# Patient Record
Sex: Female | Born: 1980 | Race: White | Hispanic: No | Marital: Married | Smoking: Never smoker
Health system: Southern US, Community
[De-identification: ages and names within clinical notes are randomized; demographics above are authoritative.]

---
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
## 2020-11-22 ENCOUNTER — Emergency Department (HOSPITAL_BASED_OUTPATIENT_CLINIC_OR_DEPARTMENT_OTHER)
Admission: EM | Admit: 2020-11-22 | Discharge: 2020-11-22 | Disposition: A | Discharge status: Home / Self Care | Payer: BC Managed Care – PPO | Attending: Emergency Medicine | Admitting: Emergency Medicine

## 2020-11-22 ENCOUNTER — Emergency Department (HOSPITAL_BASED_OUTPATIENT_CLINIC_OR_DEPARTMENT_OTHER): Payer: BC Managed Care – PPO

## 2020-11-22 ENCOUNTER — Other Ambulatory Visit: Payer: Self-pay

## 2020-11-22 ENCOUNTER — Encounter (HOSPITAL_BASED_OUTPATIENT_CLINIC_OR_DEPARTMENT_OTHER): Payer: Self-pay | Admitting: *Deleted

## 2020-11-22 DIAGNOSIS — R079 Chest pain, unspecified: Secondary | ICD-10-CM | POA: Insufficient documentation

## 2020-11-22 DIAGNOSIS — R197 Diarrhea, unspecified: Secondary | ICD-10-CM | POA: Diagnosis not present

## 2020-11-22 DIAGNOSIS — R109 Unspecified abdominal pain: Secondary | ICD-10-CM | POA: Diagnosis present

## 2020-11-22 DIAGNOSIS — K8050 Calculus of bile duct without cholangitis or cholecystitis without obstruction: Secondary | ICD-10-CM

## 2020-11-22 LAB — HEPATIC FUNCTION PANEL
ALT: 21 U/L (ref 0–44)
AST: 20 U/L (ref 15–41)
Albumin: 4.2 g/dL (ref 3.5–5.0)
Alkaline Phosphatase: 56 U/L (ref 38–126)
Bilirubin, Direct: 0.1 mg/dL (ref 0.0–0.2)
Indirect Bilirubin: 0.4 mg/dL (ref 0.3–0.9)
Total Bilirubin: 0.5 mg/dL (ref 0.3–1.2)
Total Protein: 7.4 g/dL (ref 6.5–8.1)

## 2020-11-22 LAB — CBC
HCT: 43.3 % (ref 36.0–46.0)
Hemoglobin: 14.6 g/dL (ref 12.0–15.0)
MCH: 29.9 pg (ref 26.0–34.0)
MCHC: 33.7 g/dL (ref 30.0–36.0)
MCV: 88.5 fL (ref 80.0–100.0)
Platelets: 269 10*3/uL (ref 150–400)
RBC: 4.89 MIL/uL (ref 3.87–5.11)
RDW: 12.4 % (ref 11.5–15.5)
WBC: 9.5 10*3/uL (ref 4.0–10.5)
nRBC: 0 % (ref 0.0–0.2)

## 2020-11-22 LAB — BASIC METABOLIC PANEL
Anion gap: 10 (ref 5–15)
BUN: 15 mg/dL (ref 6–20)
CO2: 24 mmol/L (ref 22–32)
Calcium: 9 mg/dL (ref 8.9–10.3)
Chloride: 105 mmol/L (ref 98–111)
Creatinine, Ser: 0.7 mg/dL (ref 0.44–1.00)
GFR, Estimated: >60 mL/min (ref >60)
Glucose, Bld: 95 mg/dL (ref 70–99)
Potassium: 3.7 mmol/L (ref 3.5–5.1)
Sodium: 139 mmol/L (ref 135–145)

## 2020-11-22 LAB — PREGNANCY, URINE: Preg Test, Ur: NEGATIVE

## 2020-11-22 LAB — LIPASE, BLOOD: Lipase: 28 U/L (ref 11–51)

## 2020-11-22 LAB — TROPONIN I (HIGH SENSITIVITY): Troponin I (High Sensitivity): <2 ng/L (ref <18)

## 2020-11-22 NOTE — Discharge Instructions (Addendum)
Follow up with General Surgery as soon as possible. Return to Emergency Room for fevers or increased pain.

## 2020-11-22 NOTE — ED Triage Notes (Signed)
She woke at 2am with "reflux" and vomiting. States she had the same thing happen 2 weeks ago. EKG done at triage.

## 2020-11-22 NOTE — ED Notes (Signed)
ED Provider at bedside. 

## 2020-11-22 NOTE — ED Notes (Signed)
Pt discharged to home. Discharge instructions have been discussed with patient and/or family members. Pt verbally acknowledges understanding d/c instructions, and endorses comprehension to checkout at registration before leaving.  °

## 2020-11-22 NOTE — ED Provider Notes (Signed)
[facility] EMERGENCY DEPARTMENT Provider Note   CSN: [id number] Arrival date & time: 11/22/20  1424     History Chief Complaint  Patient presents with  . Abdominal Pain  . Emesis    Jacqueline Evans is a 40 y.o. female.  HPI Patient presents with reflux nausea and vomiting and a slight amount of diarrhea.  Began around 2 this morning when she woke up with it.  States she had a similar episode 2 weeks ago that resolved after a day.  Has been doing well in between.  States she is feeling somewhat better in terms of her nausea.  Had vomiting was nonbilious none bloody.  Then nausea is now improved somewhat.  No fevers.  No cough.  Denies possibly of pregnancy.  She is unvaccinated for Covid.  Only abdominal surgery is C-section.  No fevers.  No chills.  No coughing.    History reviewed. No pertinent past medical history.  There are no problems to display for this patient.   Past Surgical History:  Procedure Laterality Date  . CESAREAN SECTION       OB History   No obstetric history on file.     No family history on file.  Social History   Tobacco Use  . Smoking status: Never Smoker  . Smokeless tobacco: Never Used  Substance Use Topics  . Alcohol use: Yes  . Drug use: Never    Home Medications Prior to Admission medications   Not on File    Allergies    Penicillins  Review of Systems   Review of Systems  Constitutional: Negative for appetite change and fever.  HENT: Negative for congestion.   Respiratory: Negative for shortness of breath.   Cardiovascular: Positive for chest pain.  Gastrointestinal: Positive for abdominal pain, nausea and vomiting.  Genitourinary: Negative for dysuria.  Musculoskeletal: Negative for back pain.  Skin: Negative for rash.  Neurological: Negative for weakness.  Psychiatric/Behavioral: Negative for confusion.    Physical Exam Updated Vital Signs BP (!) 112/52   Pulse 61   Temp 98.2 F (36.8 C) (Oral)    Resp (!) 22   Ht 5\' 2"  (1.575 m)   Wt 108.7 kg   LMP 11/01/2020   SpO2 97%   BMI 43.84 kg/m   Physical Exam Vitals and nursing note reviewed.  Constitutional:      Appearance: She is well-developed. She is obese.  HENT:     Head: Normocephalic.  Eyes:     General: No scleral icterus. Cardiovascular:     Rate and Rhythm: Normal rate and regular rhythm.  Pulmonary:     Effort: Pulmonary effort is normal.     Breath sounds: Normal breath sounds.  Abdominal:     Hernia: No hernia is present.     Comments: Epigastric to right upper quadrant tenderness without rebound or guarding.  Skin:    General: Skin is warm.     Capillary Refill: Capillary refill takes less than 2 seconds.  Neurological:     Mental Status: She is alert and oriented to person, place, and time.  Psychiatric:        Behavior: Behavior normal.     ED Results / Procedures / Treatments   Labs (all labs ordered are listed, but only abnormal results are displayed) Labs Reviewed  BASIC METABOLIC PANEL  CBC  PREGNANCY, URINE  HEPATIC FUNCTION PANEL  LIPASE, BLOOD  TROPONIN I (HIGH SENSITIVITY)    EKG EKG Interpretation  Date/Time:  Thursday  November 22 2020 14:34:19 EST Ventricular Rate:  75 PR Interval:  152 QRS Duration: 76 QT Interval:  372 QTC Calculation: 415 R Axis:   67 Text Interpretation: Normal sinus rhythm Abnormal ECG No old tracing to compare Confirmed by Benjiman Core [phone]) on 11/22/2020 3:12:31 PM   Radiology DG Chest 2 View  Result Date: 11/22/2020 CLINICAL DATA:  Heartburn and vomiting. EXAM: CHEST - 2 VIEW COMPARISON:  None. FINDINGS: The heart size and mediastinal contours are within normal limits. Both lungs are clear. The visualized skeletal structures are unremarkable. IMPRESSION: No active cardiopulmonary disease. Electronically Signed   By: Aram Candela M.D.   On: 11/22/2020 15:08   US Abdomen Limited  Result Date: 11/22/2020 CLINICAL DATA:  Right upper quadrant pain  Nausea Vomiting EXAM: ULTRASOUND ABDOMEN LIMITED RIGHT UPPER QUADRANT COMPARISON:  None. FINDINGS: Gallbladder: Gallbladder filled with sludge. Gallbladder wall is thickened. Findings suspicious for acute cholecystitis. Common bile duct: Diameter: 4 mm Liver: No focal lesion identified. Within normal limits in parenchymal echogenicity. Portal vein is patent on color Doppler imaging with normal direction of blood flow towards the liver. Other: None. IMPRESSION: Sludge filled gallbladder with thickening of gallbladder wall suspicious for acute cholecystitis. Electronically Signed   By: Acquanetta Belling M.D.   On: 11/22/2020 16:24    Procedures Procedures   Medications Ordered in ED Medications - No data to display  ED Course  I have reviewed the triage vital signs and the nursing notes.  Pertinent labs & imaging results that were available during my care of the patient were reviewed by me and considered in my medical decision making (see chart for details).    MDM Rules/Calculators/A&P                          Patient with nausea vomiting some abdominal pain.  Some chest pain.  Lab work is reassuring with no abnormalities.  However ultrasound done due to right upper quadrant tenderness to evaluate for biliary colic/cholecystitis.  Did show some potential wall thickening and sludge.  Patient's exam has improved still has some tenderness but states she is feeling better.  Tolerated orals throughout stay with water.  Discussed with Dr. Sheliah Hatch from general surgery.  Will expedite follow-up as an outpatient.  Likely biliary colic but may be tending towards cholecystitis but will not start antibiotics at this time with normal white count and improving symptoms.  Will return to ER sooner if symptoms return or worsen.  Will return for fevers.  Does not appear to have bowel obstruction, gastroenteritis, or appendicitis. Final Clinical Impression(s) / ED Diagnoses Final diagnoses:  Biliary colic    Rx /  DC Orders ED Discharge Orders    None       Benjiman Core, MD 11/22/20 1927

## 2020-11-22 NOTE — ED Notes (Signed)
Pt in gown, placed on 5 lead, comfortable at this time. IV placed in left [facility]

## 2022-06-25 IMAGING — US US ABDOMEN LIMITED RUQ/ASCITES
1 series · 14 of 25 positions shown · non-contrast
Comparison: None.

CLINICAL DATA: Right upper quadrant pain

Nausea
Vomiting
EXAM:
ULTRASOUND ABDOMEN LIMITED RIGHT UPPER QUADRANT

[Series 1: us abdomen limited ruq/ascites · 14 of 49 slices shown]
[im 1/49]
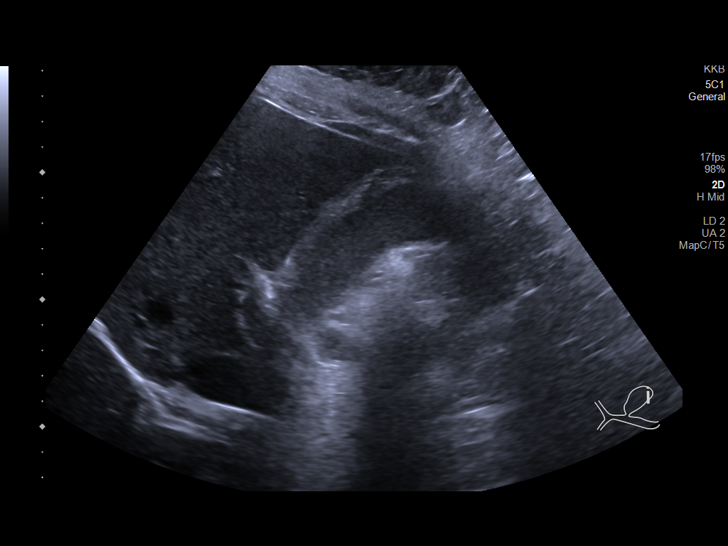
[im 5/49]
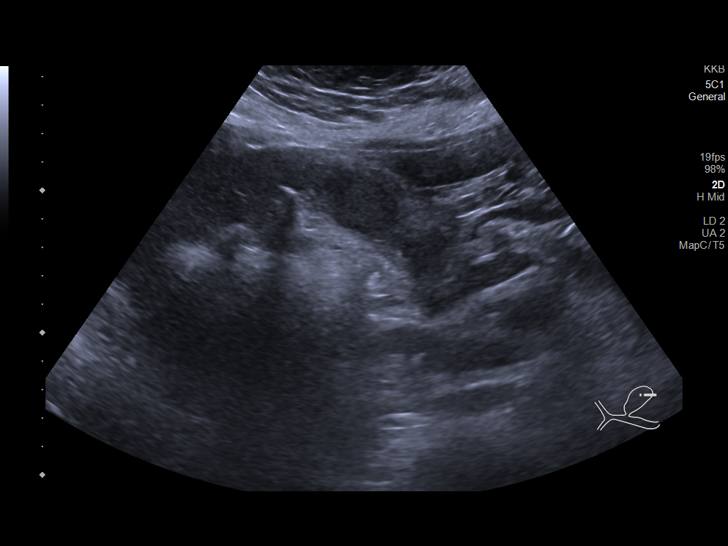
[im 9/49]
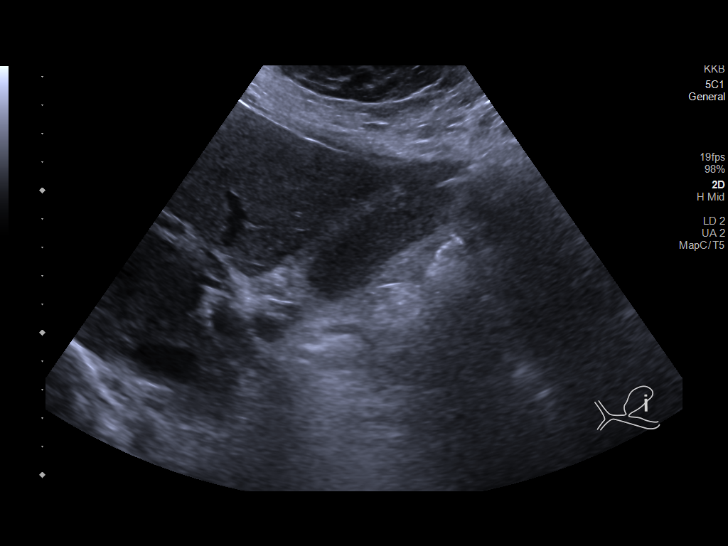
[im 13/49]
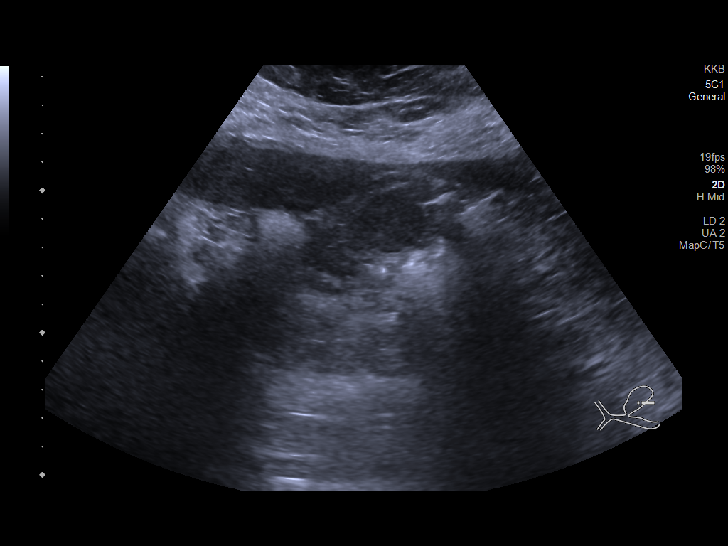
[im 17/49]
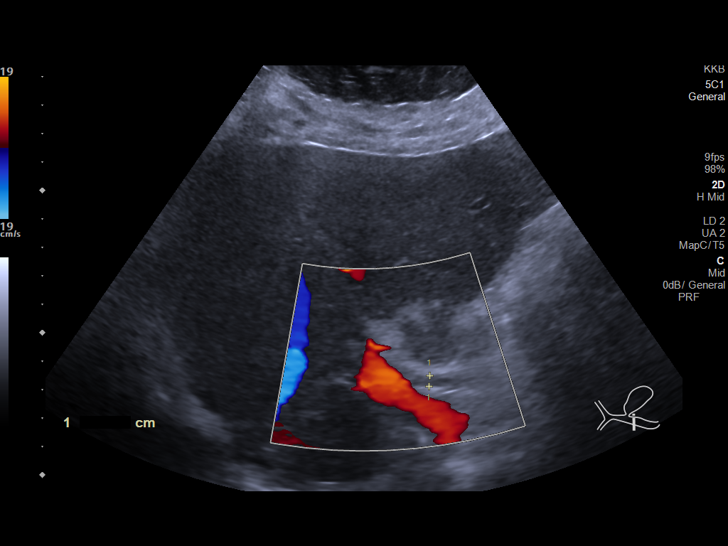
[im 19/49]
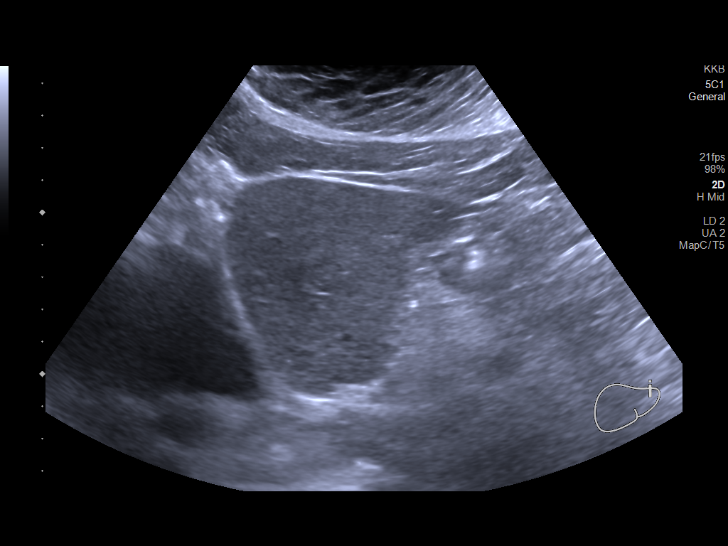
[im 23/49]
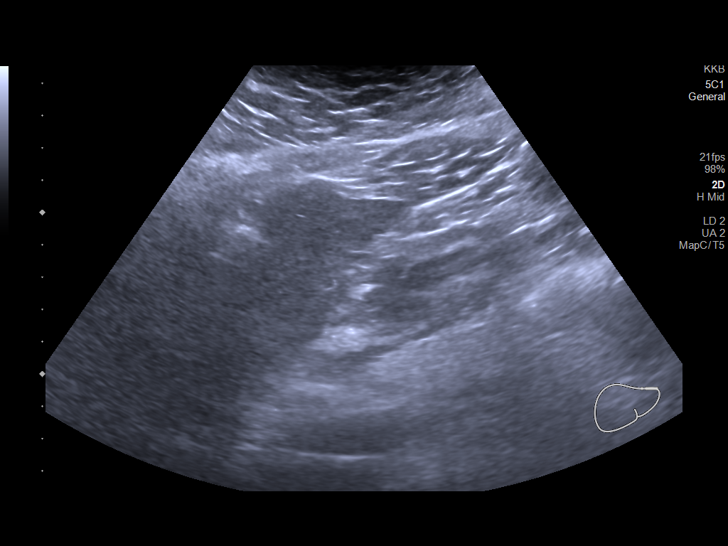
[im 27/49]
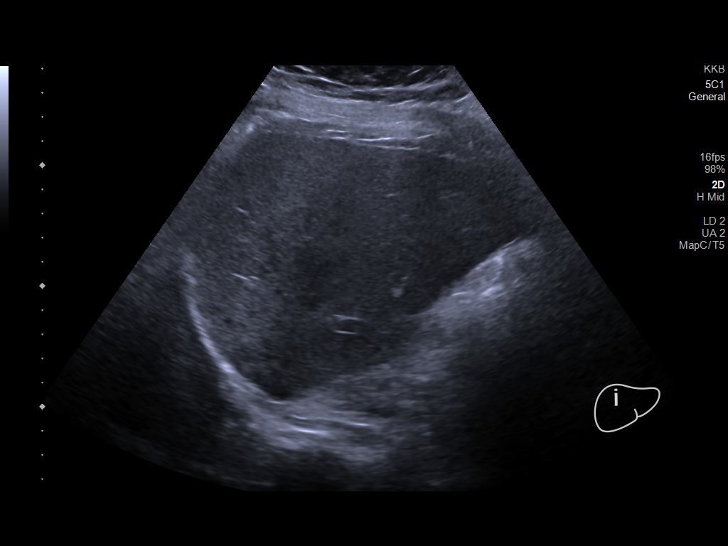
[im 31/49]
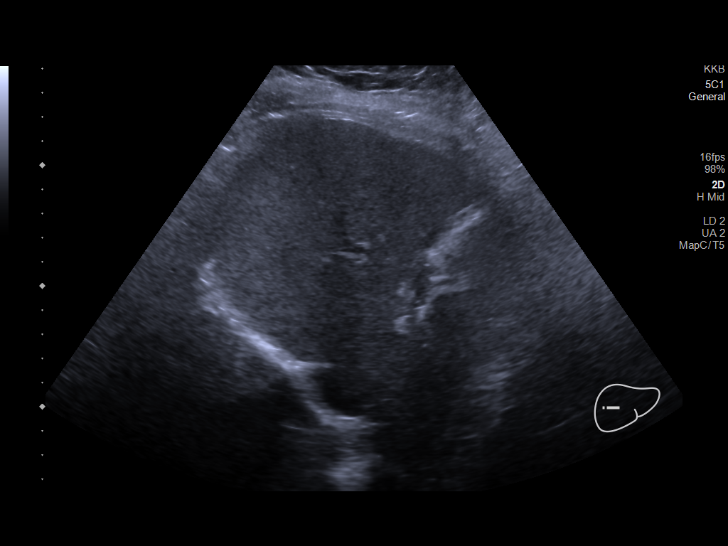
[im 33/49]
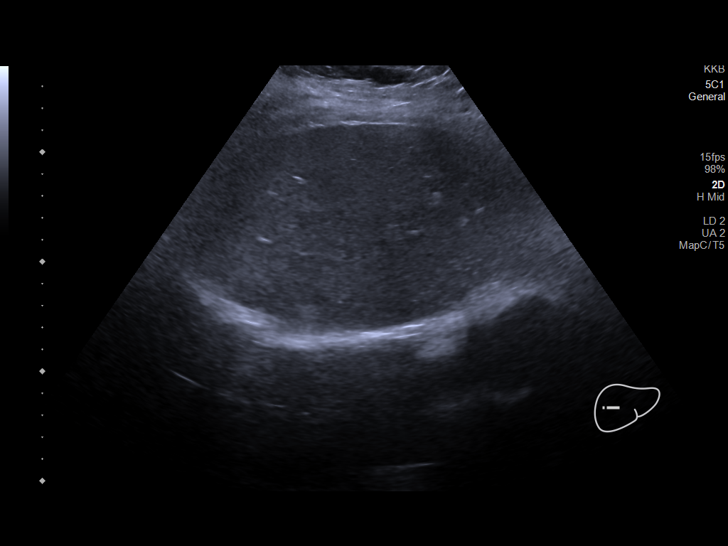
[im 37/49]
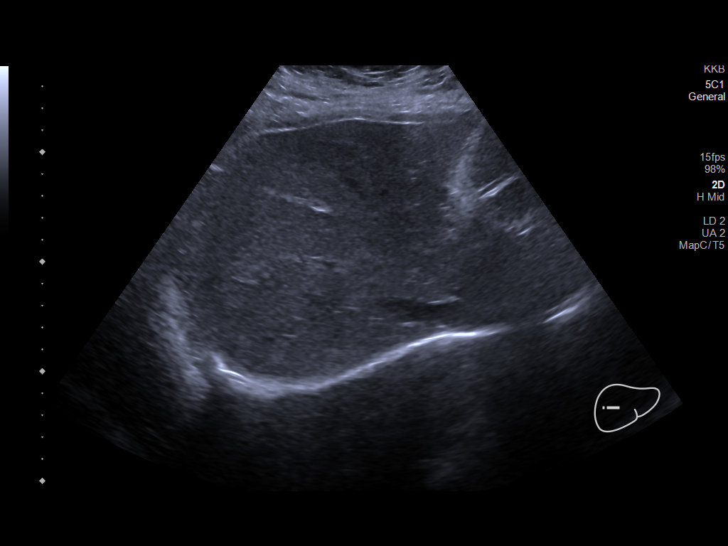
[im 41/49]
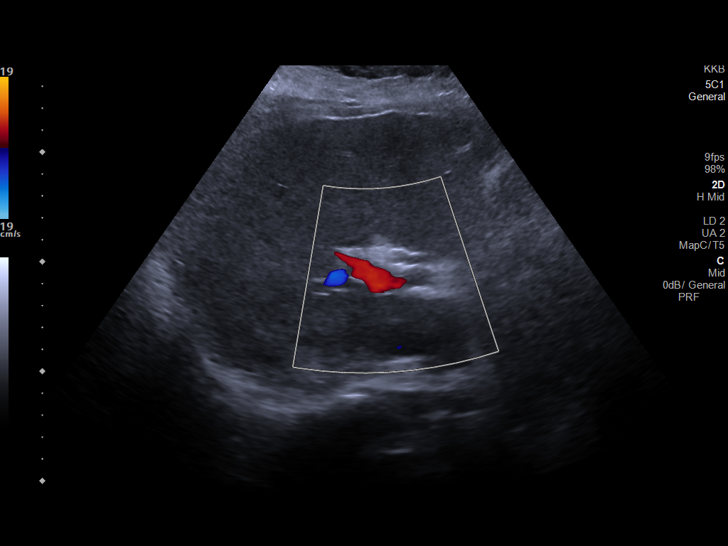
[im 45/49]
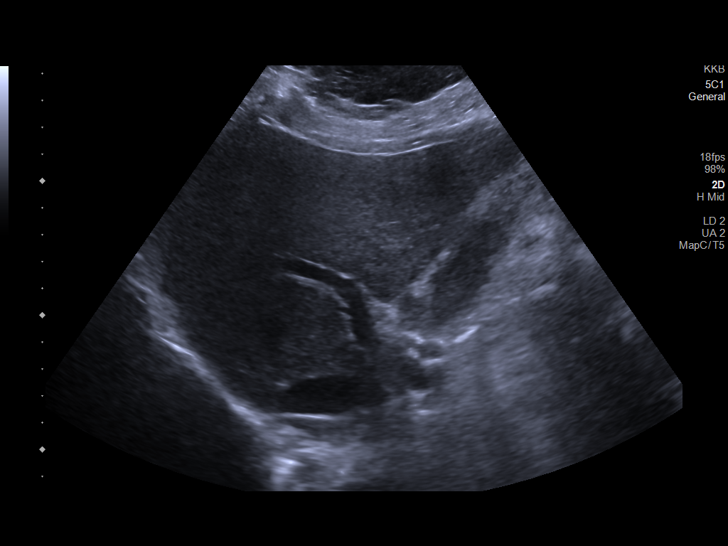
[im 49/49]
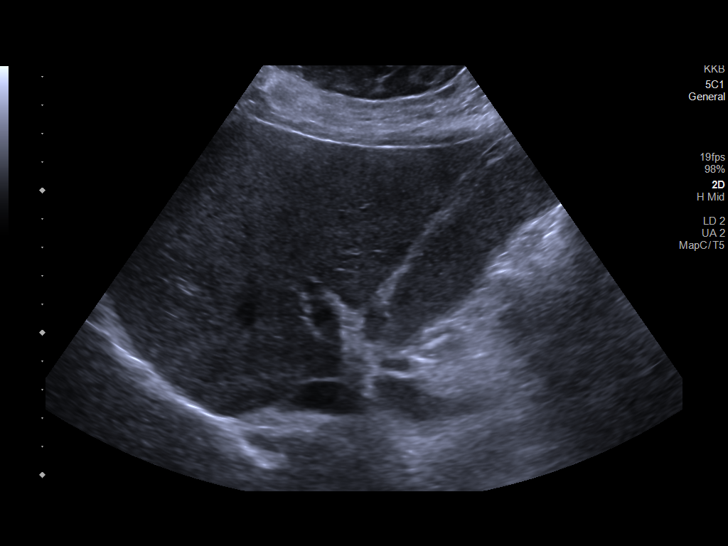

[14 of 25 positions shown; findings below may reference images not displayed]

FINDINGS: Gallbladder:

Gallbladder filled with sludge. Gallbladder wall is thickened.
Findings suspicious for acute cholecystitis.

Common bile duct:

Diameter: 4 mm

Liver:

No focal lesion identified. Within normal limits in parenchymal
echogenicity. Portal vein is patent on color Doppler imaging with
normal direction of blood flow towards the liver.

Other: None.
IMPRESSION: Sludge filled gallbladder with thickening of gallbladder wall
suspicious for acute cholecystitis.
